# Patient Record
Sex: Female | Born: 2000 | Race: Black or African American | Hispanic: No | Marital: Single | Smoking: Never smoker
Health system: Southern US, Community
[De-identification: ages and names within clinical notes are randomized; demographics above are authoritative.]

## PROBLEM LIST (undated history)

## (undated) DIAGNOSIS — I10 Essential (primary) hypertension: Secondary | ICD-10-CM

## (undated) DIAGNOSIS — F419 Anxiety disorder, unspecified: Secondary | ICD-10-CM

## (undated) DIAGNOSIS — E282 Polycystic ovarian syndrome: Secondary | ICD-10-CM

## (undated) HISTORY — PX: CHOLECYSTECTOMY: SHX55

---

## 2020-10-11 ENCOUNTER — Emergency Department (HOSPITAL_COMMUNITY)
Admission: EM | Admit: 2020-10-11 | Discharge: 2020-10-12 | Disposition: A | Discharge status: Home / Self Care | Payer: Medicaid Other | Attending: Emergency Medicine | Admitting: Emergency Medicine

## 2020-10-11 ENCOUNTER — Encounter (HOSPITAL_COMMUNITY): Payer: Self-pay | Admitting: Emergency Medicine

## 2020-10-11 DIAGNOSIS — F1590 Other stimulant use, unspecified, uncomplicated: Secondary | ICD-10-CM | POA: Insufficient documentation

## 2020-10-11 DIAGNOSIS — R000 Tachycardia, unspecified: Secondary | ICD-10-CM | POA: Insufficient documentation

## 2020-10-11 DIAGNOSIS — R4182 Altered mental status, unspecified: Secondary | ICD-10-CM | POA: Diagnosis not present

## 2020-10-11 DIAGNOSIS — R110 Nausea: Secondary | ICD-10-CM | POA: Diagnosis not present

## 2020-10-11 DIAGNOSIS — F410 Panic disorder [episodic paroxysmal anxiety] without agoraphobia: Secondary | ICD-10-CM | POA: Diagnosis not present

## 2020-10-11 DIAGNOSIS — F1290 Cannabis use, unspecified, uncomplicated: Secondary | ICD-10-CM

## 2020-10-11 DIAGNOSIS — F419 Anxiety disorder, unspecified: Secondary | ICD-10-CM | POA: Insufficient documentation

## 2020-10-11 HISTORY — DX: Anxiety disorder, unspecified: F41.9

## 2020-10-11 MED ORDER — ONDANSETRON HCL 4 MG/2ML IJ SOLN
4.0000 mg | Freq: Once | INTRAMUSCULAR | Status: AC
  Administered 2020-10-12: 4 mg via INTRAVENOUS
  Filled 2020-10-11: qty 2

## 2020-10-11 NOTE — ED Notes (Signed)
Patient vomiting and spitting on the floor despite emesis bag and trash can availability.

## 2020-10-11 NOTE — ED Triage Notes (Signed)
Patient here from [facility] reporting anxiety. Hx of same. States "I'm having a panic attack". Patient thinks she is going to die.

## 2020-10-11 NOTE — ED Provider Notes (Signed)
[facility]-EMERGENCY DEPT Provider Note   CSN: [id number] Arrival date & time: 10/11/20  2237     History Chief Complaint  Patient presents with  . Anxiety    Kristina Hines is a 20 y.o. female presents to the Emergency Department via EMS for complaint of "anxiety."  Patient reports she is nauseated but refuses to answer any additional questions.  RN reports patient has been vomiting on the floor.  No airway compromise.  Level 5 caveat for altered mental status vs uncooperative with history.    Patient's brother is on FaceTime.  Additional history gathered from him.  He reports that she called him from the emergency department approximately 5 minutes prior to my discussion stating that she was anxious.  He reports he has no additional information about what happened tonight.  He reports history of one episode of panic attack prior but he does not know when.  He reports he has no knowledge of any medical history, medications or allergies for her.   The history is provided by the patient, medical records and a relative. The history is limited by the condition of the patient. No language interpreter was used.       Past Medical History:  Diagnosis Date  . Anxiety     There are no problems to display for this patient.   OB History   No obstetric history on file.     No family history on file.  Social History   Tobacco Use  . Smoking status: Never Smoker  . Smokeless tobacco: Never Used  Substance Use Topics  . Alcohol use: Never  . Drug use: Never    Home Medications Prior to Admission medications   Medication Sig Start Date End Date Taking? Authorizing Provider  Levonorgestrel (SKYLA) 13.5 MG IUD 1 each by Intrauterine route once. 2021 June   Yes [provider]    Allergies    Patient has no known allergies.  Review of Systems   Review of Systems  Unable to perform ROS: Mental status change  Gastrointestinal: Positive for  nausea and vomiting.  Psychiatric/Behavioral: The patient is nervous/anxious.     Physical Exam Updated Vital Signs BP (!) 160/102 (BP Location: Right Arm)   Pulse (!) 104   Temp 98.7 F (37.1 C) (Oral)   Resp 20   SpO2 95%   Physical Exam Vitals and nursing note reviewed.  Constitutional:      General: She is not in acute distress.    Appearance: She is not diaphoretic.     Comments: Looks at me when I am speaking to her.  Makes eye contact.  Answers no questions.  Only states that she is nauseated.  HENT:     Head: Normocephalic.  Eyes:     General: No scleral icterus.    Conjunctiva/sclera: Conjunctivae normal.  Cardiovascular:     Rate and Rhythm: Regular rhythm. Tachycardia present.     Pulses: Normal pulses.          Radial pulses are 2+ on the right side and 2+ on the left side.  Pulmonary:     Effort: Pulmonary effort is normal. No tachypnea, accessory muscle usage, prolonged expiration, respiratory distress or retractions.     Breath sounds: Normal breath sounds. No stridor.     Comments: Equal chest rise. No increased work of breathing. Abdominal:     General: There is no distension.     Palpations: Abdomen is soft.     Tenderness:  There is no abdominal tenderness. There is no guarding or rebound.  Musculoskeletal:     Cervical back: Normal range of motion.     Comments: Moves all extremities equally and without difficulty.  Skin:    General: Skin is warm and dry.     Capillary Refill: Capillary refill takes less than 2 seconds.  Neurological:     Mental Status: She is alert.     Comments: Speech is clear.  Refuses to answer questions.  Will follow commands.  No facial droop.  Moving all extremities independently.  Psychiatric:        Mood and Affect: Mood normal.     ED Results / Procedures / Treatments   Labs (all labs ordered are listed, but only abnormal results are displayed) Labs Reviewed  COMPREHENSIVE METABOLIC PANEL - Abnormal; Notable for the  following components:      Result Value   Glucose, Bld 167 (*)    All other components within normal limits  URINALYSIS, ROUTINE W REFLEX MICROSCOPIC - Abnormal; Notable for the following components:   APPearance HAZY (*)    Hgb urine dipstick MODERATE (*)    Leukocytes,Ua SMALL (*)    Bacteria, UA RARE (*)    All other components within normal limits  RAPID URINE DRUG SCREEN, HOSP PERFORMED - Abnormal; Notable for the following components:   Tetrahydrocannabinol POSITIVE (*)    All other components within normal limits  CBC  LIPASE, BLOOD  ETHANOL  HCG, SERUM, QUALITATIVE    Radiology CT Head Wo Contrast  Result Date: 10/12/2020 CLINICAL DATA:  Panic attack.  Mental status change. EXAM: CT HEAD WITHOUT CONTRAST TECHNIQUE: Contiguous axial images were obtained from the base of the skull through the vertex without intravenous contrast. COMPARISON:  None. FINDINGS: Brain: No evidence of acute infarction, hemorrhage, hydrocephalus, extra-axial collection or mass lesion/mass effect. Vascular: No hyperdense vessel or unexpected calcification. Skull: Normal. Negative for fracture or focal lesion. Sinuses/Orbits: No acute finding. IMPRESSION: Negative head CT. Electronically Signed   By: Marnee Spring M.D.   On: 10/12/2020 05:04    Procedures Procedures   Medications Ordered in ED Medications  ondansetron (ZOFRAN) injection 4 mg (4 mg Intravenous Given 10/12/20 0113)    ED Course  I have reviewed the triage vital signs and the nursing notes.  Pertinent labs & imaging results that were available during my care of the patient were reviewed by me and considered in my medical decision making (see chart for details).    MDM Rules/Calculators/A&P                           Presents to emergency department via EMS with a chief complaint of anxiety.  Patient unable or unwilling to answer history questions. No one is here to assist with HPI.  Patient has been actively vomiting here in the  emergency department.  Unknown drug or alcohol ingestion tonight.  Patient looks at me when I speak to her but will answer no questions.  She is moving all extremities without difficulty.  Easily tracks with full EOMs.  History from brother on the phone and is reassuring however he was not present for tonight's incident.  We will start broader work-up.  Concern for psychiatric disease, alcohol or drug intoxication.  Gastroenteritis, anxiety.  Exam is nonfocal.  Given age, low risk and nonfocal neuro exam highly doubt CVA, subarachnoid hemorrhage or other neurologic deficit.  3:03 AM  Mother at bedside.  Reports the child is not talking to her either.  Unknown what happened tonight.  Patient continues to refuse to answer any of my questions.  Will obtain head CT.  5:29 AM Patient continues to refuse to answer my questions however is video chatting and texting on her phone without difficulty.  CT head without acute abnormality.  I personally evaluated these images.  6:23 AM Patient continues to follow commands, walks without difficulty and is provided urine sample.  She refuses to give me any additional history.  Work-up is reassuring.  At this time there is no metabolic cause.   6:52 AM UA without evidence of urinary tract infection.  UDS with marijuana.  Patient continues to refuse to answer questions however indicates that she is ready to go home.  Mother is comfortable with this.  Patient will follow up with her primary care physician.    Final Clinical Impression(s) / ED Diagnoses Final diagnoses:  Anxiety  Altered mental status, unspecified altered mental status type  Marijuana use    Rx / DC Orders ED Discharge Orders    None       Selenne Coggin, Boyd Kerbs 10/12/20 3893    Nira Conn, MD 10/13/20 1827

## 2020-10-12 ENCOUNTER — Emergency Department (HOSPITAL_COMMUNITY): Payer: Medicaid Other

## 2020-10-12 LAB — COMPREHENSIVE METABOLIC PANEL
ALT: 15 U/L (ref 0–44)
AST: 17 U/L (ref 15–41)
Albumin: 3.8 g/dL (ref 3.5–5.0)
Alkaline Phosphatase: 73 U/L (ref 38–126)
Anion gap: 11 (ref 5–15)
BUN: 17 mg/dL (ref 6–20)
CO2: 25 mmol/L (ref 22–32)
Calcium: 9.1 mg/dL (ref 8.9–10.3)
Chloride: 103 mmol/L (ref 98–111)
Creatinine, Ser: 0.83 mg/dL (ref 0.44–1.00)
GFR, Estimated: >60 mL/min (ref >60)
Glucose, Bld: 167 mg/dL — ABNORMAL HIGH (ref 70–99)
Potassium: 3.9 mmol/L (ref 3.5–5.1)
Sodium: 139 mmol/L (ref 135–145)
Total Bilirubin: 0.3 mg/dL (ref 0.3–1.2)
Total Protein: 7.3 g/dL (ref 6.5–8.1)

## 2020-10-12 LAB — CBC
HCT: 39.4 % (ref 36.0–46.0)
Hemoglobin: 12.3 g/dL (ref 12.0–15.0)
MCH: 28.4 pg (ref 26.0–34.0)
MCHC: 31.2 g/dL (ref 30.0–36.0)
MCV: 91 fL (ref 80.0–100.0)
Platelets: 233 10*3/uL (ref 150–400)
RBC: 4.33 MIL/uL (ref 3.87–5.11)
RDW: 13.2 % (ref 11.5–15.5)
WBC: 5.9 10*3/uL (ref 4.0–10.5)
nRBC: 0 % (ref 0.0–0.2)

## 2020-10-12 LAB — URINALYSIS, ROUTINE W REFLEX MICROSCOPIC
Bilirubin Urine: NEGATIVE
Glucose, UA: NEGATIVE mg/dL
Ketones, ur: NEGATIVE mg/dL
Nitrite: NEGATIVE
Protein, ur: NEGATIVE mg/dL
Specific Gravity, Urine: 1.019 (ref 1.005–1.030)
pH: 8 (ref 5.0–8.0)

## 2020-10-12 LAB — RAPID URINE DRUG SCREEN, HOSP PERFORMED
Amphetamines: NOT DETECTED
Barbiturates: NOT DETECTED
Benzodiazepines: NOT DETECTED
Cocaine: NOT DETECTED
Opiates: NOT DETECTED
Tetrahydrocannabinol: POSITIVE — AB

## 2020-10-12 LAB — ETHANOL: Alcohol, Ethyl (B): <10 mg/dL (ref <10)

## 2020-10-12 LAB — LIPASE, BLOOD: Lipase: 22 U/L (ref 11–51)

## 2020-10-12 LAB — HCG, SERUM, QUALITATIVE: Preg, Serum: NEGATIVE

## 2020-10-12 NOTE — Discharge Instructions (Addendum)
1. Medications: usual home medications 2. Treatment: rest, drink plenty of fluids,  3. Follow Up: Please followup with your primary doctor in 2-3 days for discussion of your diagnoses and further evaluation after today's visit; if you do not have a primary care doctor use the resource guide provided to find one; Please return to the ER for return or worsening of symptoms

## 2020-10-12 NOTE — ED Notes (Signed)
Patient transported to CT 

## 2020-10-12 NOTE — ED Notes (Signed)
Pt brought to the bathroom to provide a urine specimen, and provided an insufficient amount to analyze.

## 2021-02-14 ENCOUNTER — Other Ambulatory Visit: Payer: Self-pay

## 2021-02-14 ENCOUNTER — Emergency Department (HOSPITAL_BASED_OUTPATIENT_CLINIC_OR_DEPARTMENT_OTHER): Payer: Medicaid Other | Admitting: Radiology

## 2021-02-14 ENCOUNTER — Encounter (HOSPITAL_BASED_OUTPATIENT_CLINIC_OR_DEPARTMENT_OTHER): Payer: Self-pay

## 2021-02-14 DIAGNOSIS — Z20822 Contact with and (suspected) exposure to covid-19: Secondary | ICD-10-CM | POA: Insufficient documentation

## 2021-02-14 DIAGNOSIS — R110 Nausea: Secondary | ICD-10-CM | POA: Diagnosis not present

## 2021-02-14 DIAGNOSIS — R519 Headache, unspecified: Secondary | ICD-10-CM | POA: Diagnosis not present

## 2021-02-14 DIAGNOSIS — R1010 Upper abdominal pain, unspecified: Secondary | ICD-10-CM | POA: Diagnosis not present

## 2021-02-14 DIAGNOSIS — I10 Essential (primary) hypertension: Secondary | ICD-10-CM | POA: Insufficient documentation

## 2021-02-14 DIAGNOSIS — R509 Fever, unspecified: Secondary | ICD-10-CM | POA: Diagnosis not present

## 2021-02-14 DIAGNOSIS — R1013 Epigastric pain: Secondary | ICD-10-CM | POA: Insufficient documentation

## 2021-02-14 DIAGNOSIS — N9489 Other specified conditions associated with female genital organs and menstrual cycle: Secondary | ICD-10-CM | POA: Diagnosis not present

## 2021-02-14 LAB — URINALYSIS, ROUTINE W REFLEX MICROSCOPIC
Bilirubin Urine: NEGATIVE
Glucose, UA: NEGATIVE mg/dL
Hgb urine dipstick: NEGATIVE
Ketones, ur: NEGATIVE mg/dL
Nitrite: NEGATIVE
Protein, ur: 30 mg/dL — AB
Specific Gravity, Urine: 1.029 (ref 1.005–1.030)
pH: 8 (ref 5.0–8.0)

## 2021-02-14 LAB — CBC
HCT: 37.3 % (ref 36.0–46.0)
Hemoglobin: 12.1 g/dL (ref 12.0–15.0)
MCH: 28.4 pg (ref 26.0–34.0)
MCHC: 32.4 g/dL (ref 30.0–36.0)
MCV: 87.6 fL (ref 80.0–100.0)
Platelets: 280 10*3/uL (ref 150–400)
RBC: 4.26 MIL/uL (ref 3.87–5.11)
RDW: 13 % (ref 11.5–15.5)
WBC: 6 10*3/uL (ref 4.0–10.5)
nRBC: 0 % (ref 0.0–0.2)

## 2021-02-14 LAB — PREGNANCY, URINE: Preg Test, Ur: NEGATIVE

## 2021-02-14 LAB — LIPASE, BLOOD: Lipase: 12 U/L (ref 11–51)

## 2021-02-14 LAB — COMPREHENSIVE METABOLIC PANEL
ALT: 13 U/L (ref 0–44)
AST: 14 U/L — ABNORMAL LOW (ref 15–41)
Albumin: 4 g/dL (ref 3.5–5.0)
Alkaline Phosphatase: 66 U/L (ref 38–126)
Anion gap: 8 (ref 5–15)
BUN: 13 mg/dL (ref 6–20)
CO2: 25 mmol/L (ref 22–32)
Calcium: 9.6 mg/dL (ref 8.9–10.3)
Chloride: 103 mmol/L (ref 98–111)
Creatinine, Ser: 0.71 mg/dL (ref 0.44–1.00)
GFR, Estimated: >60 mL/min (ref >60)
Glucose, Bld: 126 mg/dL — ABNORMAL HIGH (ref 70–99)
Potassium: 4.1 mmol/L (ref 3.5–5.1)
Sodium: 136 mmol/L (ref 135–145)
Total Bilirubin: 0.3 mg/dL (ref 0.3–1.2)
Total Protein: 8.5 g/dL — ABNORMAL HIGH (ref 6.5–8.1)

## 2021-02-14 NOTE — ED Triage Notes (Signed)
Reports having abd pain and fever x 2 days.  Reports it hurts on both upper quads and in shoulder blade to neck. Had gallbladder removed last month.

## 2021-02-15 ENCOUNTER — Emergency Department (HOSPITAL_BASED_OUTPATIENT_CLINIC_OR_DEPARTMENT_OTHER)
Admission: EM | Admit: 2021-02-15 | Discharge: 2021-02-15 | Disposition: A | Discharge status: Home / Self Care | Payer: Medicaid Other | Attending: Emergency Medicine | Admitting: Emergency Medicine

## 2021-02-15 ENCOUNTER — Other Ambulatory Visit: Payer: Self-pay

## 2021-02-15 ENCOUNTER — Emergency Department (HOSPITAL_COMMUNITY)
Admission: EM | Admit: 2021-02-15 | Discharge: 2021-02-15 | Disposition: A | Discharge status: Home / Self Care | Payer: Medicaid Other | Source: Home / Self Care | Attending: Emergency Medicine | Admitting: Emergency Medicine

## 2021-02-15 ENCOUNTER — Encounter (HOSPITAL_COMMUNITY): Payer: Self-pay | Admitting: Emergency Medicine

## 2021-02-15 DIAGNOSIS — R1010 Upper abdominal pain, unspecified: Secondary | ICD-10-CM | POA: Insufficient documentation

## 2021-02-15 DIAGNOSIS — R110 Nausea: Secondary | ICD-10-CM | POA: Insufficient documentation

## 2021-02-15 DIAGNOSIS — I10 Essential (primary) hypertension: Secondary | ICD-10-CM | POA: Insufficient documentation

## 2021-02-15 DIAGNOSIS — N9489 Other specified conditions associated with female genital organs and menstrual cycle: Secondary | ICD-10-CM | POA: Insufficient documentation

## 2021-02-15 DIAGNOSIS — Z9049 Acquired absence of other specified parts of digestive tract: Secondary | ICD-10-CM | POA: Insufficient documentation

## 2021-02-15 DIAGNOSIS — R519 Headache, unspecified: Secondary | ICD-10-CM | POA: Insufficient documentation

## 2021-02-15 DIAGNOSIS — R509 Fever, unspecified: Secondary | ICD-10-CM | POA: Insufficient documentation

## 2021-02-15 DIAGNOSIS — Z20822 Contact with and (suspected) exposure to covid-19: Secondary | ICD-10-CM | POA: Insufficient documentation

## 2021-02-15 DIAGNOSIS — R1013 Epigastric pain: Secondary | ICD-10-CM

## 2021-02-15 DIAGNOSIS — R109 Unspecified abdominal pain: Secondary | ICD-10-CM

## 2021-02-15 HISTORY — DX: Polycystic ovarian syndrome: E28.2

## 2021-02-15 HISTORY — DX: Essential (primary) hypertension: I10

## 2021-02-15 LAB — LIPASE, BLOOD: Lipase: 24 U/L (ref 11–51)

## 2021-02-15 LAB — COMPREHENSIVE METABOLIC PANEL
ALT: 17 U/L (ref 0–44)
AST: 20 U/L (ref 15–41)
Albumin: 3.7 g/dL (ref 3.5–5.0)
Alkaline Phosphatase: 76 U/L (ref 38–126)
Anion gap: 6 (ref 5–15)
BUN: 12 mg/dL (ref 6–20)
CO2: 26 mmol/L (ref 22–32)
Calcium: 8.9 mg/dL (ref 8.9–10.3)
Chloride: 102 mmol/L (ref 98–111)
Creatinine, Ser: 0.75 mg/dL (ref 0.44–1.00)
GFR, Estimated: >60 mL/min (ref >60)
Glucose, Bld: 88 mg/dL (ref 70–99)
Potassium: 4 mmol/L (ref 3.5–5.1)
Sodium: 134 mmol/L — ABNORMAL LOW (ref 135–145)
Total Bilirubin: 0.6 mg/dL (ref 0.3–1.2)
Total Protein: 8.5 g/dL — ABNORMAL HIGH (ref 6.5–8.1)

## 2021-02-15 LAB — CBC WITH DIFFERENTIAL/PLATELET
Abs Immature Granulocytes: 0.01 10*3/uL (ref 0.00–0.07)
Basophils Absolute: 0 10*3/uL (ref 0.0–0.1)
Basophils Relative: 0 %
Eosinophils Absolute: 0.1 10*3/uL (ref 0.0–0.5)
Eosinophils Relative: 2 %
HCT: 38.6 % (ref 36.0–46.0)
Hemoglobin: 12.6 g/dL (ref 12.0–15.0)
Immature Granulocytes: 0 %
Lymphocytes Relative: 35 %
Lymphs Abs: 1.7 10*3/uL (ref 0.7–4.0)
MCH: 29.4 pg (ref 26.0–34.0)
MCHC: 32.6 g/dL (ref 30.0–36.0)
MCV: 90.2 fL (ref 80.0–100.0)
Monocytes Absolute: 0.4 10*3/uL (ref 0.1–1.0)
Monocytes Relative: 8 %
Neutro Abs: 2.6 10*3/uL (ref 1.7–7.7)
Neutrophils Relative %: 55 %
Platelets: 258 10*3/uL (ref 150–400)
RBC: 4.28 MIL/uL (ref 3.87–5.11)
RDW: 12.8 % (ref 11.5–15.5)
WBC: 4.8 10*3/uL (ref 4.0–10.5)
nRBC: 0 % (ref 0.0–0.2)

## 2021-02-15 LAB — URINALYSIS, ROUTINE W REFLEX MICROSCOPIC
Bacteria, UA: NONE SEEN
Bilirubin Urine: NEGATIVE
Glucose, UA: NEGATIVE mg/dL
Hgb urine dipstick: NEGATIVE
Ketones, ur: NEGATIVE mg/dL
Nitrite: NEGATIVE
Protein, ur: NEGATIVE mg/dL
Specific Gravity, Urine: 1.024 (ref 1.005–1.030)
pH: 6 (ref 5.0–8.0)

## 2021-02-15 LAB — RESP PANEL BY RT-PCR (FLU A&B, COVID) ARPGX2
Influenza A by PCR: NEGATIVE
Influenza B by PCR: NEGATIVE
SARS Coronavirus 2 by RT PCR: NEGATIVE

## 2021-02-15 LAB — I-STAT BETA HCG BLOOD, ED (MC, WL, AP ONLY): I-stat hCG, quantitative: <5 m[IU]/mL (ref <5)

## 2021-02-15 MED ORDER — SUCRALFATE 1 G PO TABS: 1.0000 g | ORAL_TABLET | Freq: Four times a day (QID) | ORAL | 0 refills | Status: DC | PRN

## 2021-02-15 MED ORDER — ALUM & MAG HYDROXIDE-SIMETH 200-200-20 MG/5ML PO SUSP
30.0000 mL | Freq: Once | ORAL | Status: AC
  Administered 2021-02-15: 30 mL via ORAL
  Filled 2021-02-15: qty 30

## 2021-02-15 MED ORDER — OMEPRAZOLE 20 MG PO CPDR: 20.0000 mg | DELAYED_RELEASE_CAPSULE | Freq: Every day | ORAL | 1 refills | Status: DC

## 2021-02-15 NOTE — ED Provider Notes (Signed)
[facility] EMERGENCY DEPARTMENT Provider Note   CSN: [id number] Arrival date & time: 02/15/21  1208     History No chief complaint on file.   Kristina Hines is a 20 y.o. female.  20 year old female with history of hypertension, PCOS, anxiety with recent cholecystectomy on July 19 presents with complaint of ongoing upper abdominal pain, worse with eating radiates to left shoulder blade.  Also reports fever with T-max of 102, is having nightly fevers for the past 5 nights.  Patient states that she went to urgent care yesterday where her temperature was 102, temperature eventually improved and she was discharged. No complications with surgery, healed well and cleared by surgery at post op visit. Also reports nausea and a headache.      Past Medical History:  Diagnosis Date   Anxiety    Hypertension    PCOS (polycystic ovarian syndrome)     There are no problems to display for this patient.   Past Surgical History:  Procedure Laterality Date   CHOLECYSTECTOMY       OB History   No obstetric history on file.     No family history on file.  Social History   Tobacco Use   Smoking status: Never   Smokeless tobacco: Never  Vaping Use   Vaping Use: Never used  Substance Use Topics   Alcohol use: Never   Drug use: Never    Home Medications Prior to Admission medications   Medication Sig Start Date End Date Taking? Authorizing Provider  Levonorgestrel (SKYLA) 13.5 MG IUD 1 each by Intrauterine route once. 2021 June    [provider]  omeprazole (PRILOSEC) 20 MG capsule Take 1 capsule (20 mg total) by mouth daily. 02/15/21   Sabas Sous, MD  sucralfate (CARAFATE) 1 g tablet Take 1 tablet (1 g total) by mouth 4 (four) times daily as needed. 02/15/21   Sabas Sous, MD    Allergies    Patient has no known allergies.  Review of Systems   Review of Systems  Constitutional:  Positive for fever.  HENT:  Negative for sore throat.    Respiratory:  Negative for cough.   Cardiovascular:  Negative for chest pain.  Gastrointestinal:  Positive for abdominal pain and nausea. Negative for constipation, diarrhea and vomiting.  Genitourinary:  Negative for dysuria.  Musculoskeletal:  Negative for arthralgias and myalgias.  Skin:  Negative for rash and wound.  Allergic/Immunologic: Negative for immunocompromised state.  Neurological:  Positive for headaches.  Hematological:  Negative for adenopathy.  Psychiatric/Behavioral:  Negative for confusion.   All other systems reviewed and are negative.  Physical Exam Updated Vital Signs BP (!) 136/92 (BP Location: Right Arm)   Pulse 85   Temp 98.5 F (36.9 C) (Oral)   Resp 17   LMP 01/21/2021   SpO2 100%   Physical Exam Vitals and nursing note reviewed.  Constitutional:      General: She is not in acute distress.    Appearance: She is well-developed. She is not diaphoretic.  HENT:     Head: Normocephalic and atraumatic.  Cardiovascular:     Rate and Rhythm: Normal rate and regular rhythm.     Pulses: Normal pulses.     Heart sounds: Normal heart sounds.  Pulmonary:     Effort: Pulmonary effort is normal.     Breath sounds: Normal breath sounds.  Abdominal:     Palpations: Abdomen is soft.     Tenderness: There is no  abdominal tenderness.  Musculoskeletal:     Right lower leg: No edema.     Left lower leg: No edema.  Skin:    General: Skin is warm and dry.     Findings: No erythema or rash.  Neurological:     Mental Status: She is alert and oriented to person, place, and time.  Psychiatric:        Behavior: Behavior normal.    ED Results / Procedures / Treatments   Labs (all labs ordered are listed, but only abnormal results are displayed) Labs Reviewed  RESP PANEL BY RT-PCR (FLU A&B, COVID) ARPGX2  CULTURE, BLOOD (ROUTINE X 2)  CULTURE, BLOOD (ROUTINE X 2)  COMPREHENSIVE METABOLIC PANEL  LIPASE, BLOOD  CBC WITH DIFFERENTIAL/PLATELET  URINALYSIS,  ROUTINE W REFLEX MICROSCOPIC  I-STAT BETA HCG BLOOD, ED ([facility], [facility], [facility] ONLY)    EKG None  Radiology DG Chest 1 View  Result Date: 02/14/2021 CLINICAL DATA:  Abdominal pain and fever x2 days. EXAM: CHEST  1 VIEW COMPARISON:  None. FINDINGS: Decreased lung volumes are seen which is likely secondary to the degree of patient inspiration. There is no evidence of an acute infiltrate, pleural effusion or pneumothorax. The heart size and mediastinal contours are within normal limits. The visualized skeletal structures are unremarkable. IMPRESSION: No active cardiopulmonary disease. Electronically Signed   By: Aram Candela M.D.   On: 02/14/2021 22:14    Procedures Procedures   Medications Ordered in ED Medications - No data to display  ED Course  I have reviewed the triage vital signs and the nursing notes.  Pertinent labs & imaging results that were available during my care of the patient were reviewed by me and considered in my medical decision making (see chart for details).  Clinical Course as of 02/15/21 1553  Sat Feb 15, 2021  4524 20 year old female with abdominal pain, headache and nightly fevers as above.  On exam, is well-appearing, abdomen soft and nontender.  Patient was seen at [facility] ER yesterday/early this morning for same (I believe this is what she is referencing is her urgent care visit).  Labs were reassuring at that time and she was discharged.  Plan is to assess labs, if no significant change, will refer to GI. [LM]  1553 Care signed out pending labs. [LM]    Clinical Course User Index [LM] Alden Hipp   MDM Rules/Calculators/A&P                           Final Clinical Impression(s) / ED Diagnoses Final diagnoses:  None    Rx / DC Orders ED Discharge Orders     None        Jeannie Fend, PA-C 02/15/21 1553    Sloan Leiter, DO 02/16/21 1119

## 2021-02-15 NOTE — ED Notes (Signed)
Pt discharged from [facility] ED this morning.  See lab results from previous visit.

## 2021-02-15 NOTE — ED Provider Notes (Signed)
Care assumed from L. Murphy PA-C at shift change pending labs.  See her note for full H&P.   Briefly this is a 20 yo female presenting for second ED visit today with chief complaint of fevers and left upper quadrant pain radiating to the left shoulder.  Patient had recent cholecystectomy 01/16/2021.  Patient reporting intermittent fevers x5 days.  She is afebrile here as well as at previous ED visit.  No antipyretics taken in the last 6 hours.  Patient had normal labs at previous ED visit.  Plan per previous provider was to repeat labs and reassess patient.  Physical Exam  BP 122/63 (BP Location: Left Arm)   Pulse 73   Temp 98.5 F (36.9 C) (Oral)   Resp 16   LMP 01/21/2021   SpO2 100%   Physical Exam PE: Constitutional: well-developed, well-nourished, no apparent distress HENT: normocephalic, atraumatic. no cervical adenopathy Cardiovascular: normal rate and rhythm, distal pulses intact Pulmonary/Chest: effort normal; breath sounds clear and equal bilaterally; no wheezes or rales Abdominal: soft and nontender Musculoskeletal: full ROM, no edema Neurological: alert with goal directed thinking Skin: warm and dry, no rash, no diaphoresis Psychiatric: normal mood and affect, normal behavior   ED Course/Procedures    Results for orders placed or performed during the hospital encounter of 02/15/21 (from the past 24 hour(s))  Resp Panel by RT-PCR (Flu A&B, Covid) Nasopharyngeal Swab     Status: None   Collection Time: 02/15/21 12:51 PM   Specimen: Nasopharyngeal Swab; Nasopharyngeal(NP) swabs in vial transport medium  Result Value Ref Range   SARS Coronavirus 2 by RT PCR NEGATIVE NEGATIVE   Influenza A by PCR NEGATIVE NEGATIVE   Influenza B by PCR NEGATIVE NEGATIVE  Comprehensive metabolic panel     Status: Abnormal   Collection Time: 02/15/21  3:40 PM  Result Value Ref Range   Sodium 134 (L) 135 - 145 mmol/L   Potassium 4.0 3.5 - 5.1 mmol/L   Chloride 102 98 - 111 mmol/L   CO2 26  22 - 32 mmol/L   Glucose, Bld 88 70 - 99 mg/dL   BUN 12 6 - 20 mg/dL   Creatinine, Ser 4.17 0.44 - 1.00 mg/dL   Calcium 8.9 8.9 - 40.8 mg/dL   Total Protein 8.5 (H) 6.5 - 8.1 g/dL   Albumin 3.7 3.5 - 5.0 g/dL   AST 20 15 - 41 U/L   ALT 17 0 - 44 U/L   Alkaline Phosphatase 76 38 - 126 U/L   Total Bilirubin 0.6 0.3 - 1.2 mg/dL   GFR, Estimated >14 >48 mL/min   Anion gap 6 5 - 15  Lipase, blood     Status: None   Collection Time: 02/15/21  3:40 PM  Result Value Ref Range   Lipase 24 11 - 51 U/L  CBC with Differential     Status: None   Collection Time: 02/15/21  3:40 PM  Result Value Ref Range   WBC 4.8 4.0 - 10.5 K/uL   RBC 4.28 3.87 - 5.11 MIL/uL   Hemoglobin 12.6 12.0 - 15.0 g/dL   HCT 18.5 63.1 - 49.7 %   MCV 90.2 80.0 - 100.0 fL   MCH 29.4 26.0 - 34.0 pg   MCHC 32.6 30.0 - 36.0 g/dL   RDW 02.6 37.8 - 58.8 %   Platelets 258 150 - 400 K/uL   nRBC 0.0 0.0 - 0.2 %   Neutrophils Relative % 55 %   Neutro Abs 2.6 1.7 - 7.7  K/uL   Lymphocytes Relative 35 %   Lymphs Abs 1.7 0.7 - 4.0 K/uL   Monocytes Relative 8 %   Monocytes Absolute 0.4 0.1 - 1.0 K/uL   Eosinophils Relative 2 %   Eosinophils Absolute 0.1 0.0 - 0.5 K/uL   Basophils Relative 0 %   Basophils Absolute 0.0 0.0 - 0.1 K/uL   Immature Granulocytes 0 %   Abs Immature Granulocytes 0.01 0.00 - 0.07 K/uL  Urinalysis, Routine w reflex microscopic Urine, Clean Catch     Status: Abnormal   Collection Time: 02/15/21  3:40 PM  Result Value Ref Range   Color, Urine YELLOW YELLOW   APPearance HAZY (A) CLEAR   Specific Gravity, Urine 1.024 1.005 - 1.030   pH 6.0 5.0 - 8.0   Glucose, UA NEGATIVE NEGATIVE mg/dL   Hgb urine dipstick NEGATIVE NEGATIVE   Bilirubin Urine NEGATIVE NEGATIVE   Ketones, ur NEGATIVE NEGATIVE mg/dL   Protein, ur NEGATIVE NEGATIVE mg/dL   Nitrite NEGATIVE NEGATIVE   Leukocytes,Ua MODERATE (A) NEGATIVE   RBC / HPF 0-5 0 - 5 RBC/hpf   WBC, UA 11-20 0 - 5 WBC/hpf   Bacteria, UA NONE SEEN NONE SEEN    Squamous Epithelial / LPF 6-10 0 - 5   Mucus PRESENT   I-Stat beta hCG blood, ED     Status: None   Collection Time: 02/15/21  3:59 PM  Result Value Ref Range   I-stat hCG, quantitative <5.0 <5 mIU/mL   Comment 3             MDM  Patient received in sign out. Please see previous provider note to include MDM up to this point.   Work-up here is overall unremarkable.  Urine is negative for signs of infection.  Pregnancy test is negative.  She had a chest x-ray at previous ED visit that showed no signs of acute infectious processes.  Blood cultures were collected given patient's complaint of intermittent fever x5 days.  COVID and flu test were negative as well.  Patient continues to have a benign abdomen on serial exams.  Engaged in shared decision-making with patient regarding further imaging.  With a nontender abdomen and normal labs do not feel further work-up is needed at this time of this and patient is agreeable.  Discussed symptomatic home care and follow-up with PCP or GI if symptoms continue.  Strict return precautions discussed.   Portions of this note were generated with Scientist, clinical (histocompatibility and immunogenetics). Dictation errors may occur despite best attempts at proofreading.        Shanon Ace, PA-C 02/15/21 1731    Sloan Leiter, DO 02/16/21 1118

## 2021-02-15 NOTE — ED Provider Notes (Signed)
[facility] EMERGENCY [facility] Emergency Department Provider Note MRN:  [id number]  Arrival date & time: 02/15/21     Chief Complaint   Abdominal Pain   History of Present Illness   Kristina Hines is a 20 y.o. year-old female with a history of anxiety presenting to the ED with chief complaint of abdominal.  Location: Left upper quadrant with radiation to the left shoulder Duration: A few days Onset: Gradual Timing: Intermittent Description: Dull ache Severity: Mild to moderate Exacerbating/Alleviating Factors: None Associated Symptoms: Subjective fever, headache Pertinent Negatives: Denies chest pain or shortness of breath, no dysuria, no hematuria   Review of Systems  A complete 10 system review of systems was obtained and all systems are negative except as noted in the HPI and PMH.   Patient's Health History    Past Medical History:  Diagnosis Date   Anxiety    Hypertension    PCOS (polycystic ovarian syndrome)     Past Surgical History:  Procedure Laterality Date   CHOLECYSTECTOMY      No family history on file.  Social History   Socioeconomic History   Marital status: Single    Spouse name: Not on file   Number of children: Not on file   Years of education: Not on file   Highest education level: Not on file  Occupational History   Not on file  Tobacco Use   Smoking status: Never   Smokeless tobacco: Never  Vaping Use   Vaping Use: Never used  Substance and Sexual Activity   Alcohol use: Never   Drug use: Never   Sexual activity: Never  Other Topics Concern   Not on file  Social History Narrative   Not on file   Social Determinants of Health   Financial Resource Strain: Not on file  Food Insecurity: Not on file  Transportation Needs: Not on file  Physical Activity: Not on file  Stress: Not on file  Social Connections: Not on file  Intimate Partner Violence: Not on file     Physical Exam   Vitals:   02/15/21 0030 02/15/21 0200   BP: 131/68 127/61  Pulse: 83 67  Resp: 18 18  Temp:    SpO2: 97% 98%    CONSTITUTIONAL: Well-appearing, NAD NEURO:  Alert and oriented x 3, no focal deficits EYES:  eyes equal and reactive ENT/NECK:  no LAD, no JVD CARDIO: Regular rate, well-perfused, normal S1 and S2 PULM:  CTAB no wheezing or rhonchi GI/GU:  normal bowel sounds, non-distended, non-tender MSK/SPINE:  No gross deformities, no edema SKIN:  no rash, atraumatic PSYCH:  Appropriate speech and behavior  *Additional and/or pertinent findings included in MDM below  Diagnostic and Interventional Summary    EKG Interpretation  Date/Time:  Friday February 14 2021 21:17:07 EDT Ventricular Rate:  98 PR Interval:  122 QRS Duration: 82 QT Interval:  336 QTC Calculation: 428 R Axis:   40 Text Interpretation: Normal sinus rhythm Normal ECG Confirmed by Ernie Avena ([id number]) on 02/15/2021 12:02:40 AM       Labs Reviewed  COMPREHENSIVE METABOLIC PANEL - Abnormal; Notable for the following components:      Result Value   Glucose, Bld 126 (*)    Total Protein 8.5 (*)    AST 14 (*)    All other components within normal limits  URINALYSIS, ROUTINE W REFLEX MICROSCOPIC - Abnormal; Notable for the following components:   APPearance HAZY (*)    Protein, ur 30 (*)    Leukocytes,Ua  LARGE (*)    Bacteria, UA FEW (*)    All other components within normal limits  LIPASE, BLOOD  CBC  PREGNANCY, URINE    DG Chest 1 View  Final Result      Medications  alum & mag hydroxide-simeth (MAALOX/MYLANTA) 200-200-20 MG/5ML suspension 30 mL (30 mLs Oral Given 02/15/21 0122)     Procedures  /  Critical Care Procedures  ED Course and Medical Decision Making  I have reviewed the triage vital signs, the nursing notes, and pertinent available records from the EMR.  Listed above are laboratory and imaging tests that I personally ordered, reviewed, and interpreted and then considered in my medical decision making (see below for  details).  Patient with a fairly recent history of cholecystectomy.  Initial considerations include retained stone, splenic pathology, however patient is very well-appearing with normal vital signs and a completely benign abdomen, no rebound guarding or rigidity.  There is no leukocytosis, there are normal LFTs, labs are normal.  Discussed management options with patient, decided to try to avoid CT imaging on this otherwise healthy young female given the low likelihood of there being an emergent process.  Favoring a gastritis, possibly viral.  Patient is feeling much better after GI cocktail, appropriate for discharge with return precautions.       Elmer Sow. Pilar Plate, MD [facility] Emergency Medicine [facility] [email or web address]  Final Clinical Impressions(s) / ED Diagnoses     ICD-10-CM   1. Epigastric pain  R10.13       ED Discharge Orders          Ordered    omeprazole (PRILOSEC) 20 MG capsule  Daily        02/15/21 0214    sucralfate (CARAFATE) 1 g tablet  4 times daily PRN        02/15/21 0214             Discharge Instructions Discussed with and Provided to Patient:    Discharge Instructions      You were evaluated in the Emergency Department and after careful evaluation, we did not find any emergent condition requiring admission or further testing in the hospital.  Your exam/testing today is overall reassuring.  Symptoms may be due to acid reflux.  Recommend taking the omeprazole daily to prevent pain.  Use the Carafate medication as needed for immediate relief, up to 4 times daily.  Recommend hydrating at home tomorrow.  Please return to the Emergency Department if you experience any worsening of your condition.   Thank you for allowing [place] to be a part of your care.       Sabas Sous, MD 02/15/21 [phone]

## 2021-02-15 NOTE — ED Notes (Signed)
Pt verbalizes understanding of discharge instructions. Opportunity for questioning and answers were provided. Armand removed by staff, pt discharged from ED to home. Educated to pick up Rx.  

## 2021-02-15 NOTE — Discharge Instructions (Addendum)
You were evaluated in the Emergency Department and after careful evaluation, we did not find any emergent condition requiring admission or further testing in the hospital.  Your exam/testing today is overall reassuring.  Symptoms may be due to acid reflux.  Recommend taking the omeprazole daily to prevent pain.  Use the Carafate medication as needed for immediate relief, up to 4 times daily.  Recommend hydrating at home tomorrow.  Please return to the Emergency Department if you experience any worsening of your condition.   Thank you for allowing [place] to be a part of your care.

## 2021-02-15 NOTE — ED Triage Notes (Signed)
Reports fever, headache, nausea, L shoulder pain, and RUQ pain x 3 days.  Cholecystectomy last month.

## 2021-02-15 NOTE — Discharge Instructions (Addendum)
Your lab work was all normal. Your covid and flu tests were negative.  Continue to take Tylenol and ibuprofen for fever and headaches at home.  Follow-up with your primary care doctor if you continue to have symptoms.  If you continue to have abdominal pain you can follow-up with the local GI group.  I have given you the information for the on-call group [facility].  Call their office if needed.

## 2021-02-16 ENCOUNTER — Other Ambulatory Visit: Payer: Self-pay

## 2021-02-16 ENCOUNTER — Encounter (HOSPITAL_BASED_OUTPATIENT_CLINIC_OR_DEPARTMENT_OTHER): Payer: Self-pay | Admitting: Emergency Medicine

## 2021-02-16 ENCOUNTER — Emergency Department (HOSPITAL_BASED_OUTPATIENT_CLINIC_OR_DEPARTMENT_OTHER): Payer: Medicaid Other

## 2021-02-16 ENCOUNTER — Emergency Department (HOSPITAL_BASED_OUTPATIENT_CLINIC_OR_DEPARTMENT_OTHER)
Admission: EM | Admit: 2021-02-16 | Discharge: 2021-02-17 | Disposition: A | Discharge status: Home / Self Care | Payer: Medicaid Other | Attending: Emergency Medicine | Admitting: Emergency Medicine

## 2021-02-16 DIAGNOSIS — M542 Cervicalgia: Secondary | ICD-10-CM | POA: Diagnosis not present

## 2021-02-16 DIAGNOSIS — Z20822 Contact with and (suspected) exposure to covid-19: Secondary | ICD-10-CM | POA: Insufficient documentation

## 2021-02-16 DIAGNOSIS — I10 Essential (primary) hypertension: Secondary | ICD-10-CM | POA: Diagnosis not present

## 2021-02-16 DIAGNOSIS — Z7984 Long term (current) use of oral hypoglycemic drugs: Secondary | ICD-10-CM | POA: Insufficient documentation

## 2021-02-16 DIAGNOSIS — R110 Nausea: Secondary | ICD-10-CM | POA: Insufficient documentation

## 2021-02-16 DIAGNOSIS — R509 Fever, unspecified: Secondary | ICD-10-CM | POA: Insufficient documentation

## 2021-02-16 DIAGNOSIS — R519 Headache, unspecified: Secondary | ICD-10-CM | POA: Diagnosis present

## 2021-02-16 DIAGNOSIS — R109 Unspecified abdominal pain: Secondary | ICD-10-CM | POA: Insufficient documentation

## 2021-02-16 DIAGNOSIS — Z79899 Other long term (current) drug therapy: Secondary | ICD-10-CM | POA: Diagnosis not present

## 2021-02-16 MED ORDER — IBUPROFEN 400 MG PO TABS
600.0000 mg | ORAL_TABLET | Freq: Once | ORAL | Status: AC
  Administered 2021-02-16: 600 mg via ORAL
  Filled 2021-02-16: qty 1

## 2021-02-16 MED ORDER — KETOROLAC TROMETHAMINE 30 MG/ML IJ SOLN
30.0000 mg | Freq: Once | INTRAMUSCULAR | Status: AC
  Administered 2021-02-16: 30 mg via INTRAMUSCULAR
  Filled 2021-02-16: qty 1

## 2021-02-16 MED ORDER — PROMETHAZINE HCL 25 MG/ML IJ SOLN
25.0000 mg | Freq: Four times a day (QID) | INTRAMUSCULAR | Status: DC | PRN
  Administered 2021-02-16: 25 mg via INTRAMUSCULAR
  Filled 2021-02-16: qty 1

## 2021-02-16 NOTE — ED Provider Notes (Signed)
[facility] EMERGENCY DEPT Provider Note   CSN: [id number] Arrival date & time: 02/16/21  1736     History Chief Complaint  Patient presents with   Fever    Kristina Hines is a 20 y.o. female.  HPI     This is a 20 year old female with a history of anxiety, hypertension, PCOS who presents with headache, neck pain, fever.  Reports subjective fevers over the last 5 days.  She is only taken her temperature 2 days ago when it was 102.  She was seen and evaluated in the emergency room twice over the last 2 days for abdominal pain.  She been afebrile during this visits.  She was seen mostly for abdominal pain but states at that time she also had some nausea and headache.  She describes the headache as 10 out of 10.  It is throbbing.  It originates in her neck and radiates upwards.  She denies vision changes, weakness, numbness, strokelike symptoms.  She denies neck stiffness.  No known sick contacts.  Tested negative for COVID and flu 2 days ago.  Denies sore throat or rash.  She has taken Tylenol with minimal relief.  Past Medical History:  Diagnosis Date   Anxiety    Hypertension    PCOS (polycystic ovarian syndrome)     There are no problems to display for this patient.   Past Surgical History:  Procedure Laterality Date   CHOLECYSTECTOMY       OB History   No obstetric history on file.     No family history on file.  Social History   Tobacco Use   Smoking status: Never   Smokeless tobacco: Never  Vaping Use   Vaping Use: Never used  Substance Use Topics   Alcohol use: Never   Drug use: Never    Home Medications Prior to Admission medications   Medication Sig Start Date End Date Taking? Authorizing Provider  amLODipine (NORVASC) 5 MG tablet Take 5 mg by mouth daily.    [provider]  busPIRone (BUSPAR) 5 MG tablet Take 5 mg by mouth 2 (two) times daily. 10/16/20   [provider]  HYDROcodone-acetaminophen (NORCO) 10-325 MG  tablet Take 1 tablet by mouth every 6 (six) hours as needed for pain. 01/21/21   [provider]  Levonorgestrel (SKYLA) 13.5 MG IUD 1 each by Intrauterine route once. 2021 June    [provider]  metFORMIN (GLUCOPHAGE) 500 MG tablet Take 500 mg by mouth in the morning and at bedtime.    [provider]  omeprazole (PRILOSEC) 20 MG capsule Take 1 capsule (20 mg total) by mouth daily. 02/15/21   Sabas Sous, MD  sucralfate (CARAFATE) 1 g tablet Take 1 tablet (1 g total) by mouth 4 (four) times daily as needed. 02/15/21   Sabas Sous, MD    Allergies    Patient has no known allergies.  Review of Systems   Review of Systems  Constitutional:  Positive for fever.  Respiratory:  Negative for chest tightness and shortness of breath.   Cardiovascular:  Negative for chest pain.  Gastrointestinal:  Positive for nausea. Negative for abdominal pain and vomiting.  Musculoskeletal:  Positive for neck pain. Negative for neck stiffness.  Neurological:  Positive for headaches. Negative for dizziness, speech difficulty and weakness.  All other systems reviewed and are negative.  Physical Exam Updated Vital Signs BP 125/68   Pulse 80   Temp 99.8 F (37.7 C) (Oral)  Resp 18   LMP 01/21/2021   SpO2 97%   Physical Exam Vitals and nursing note reviewed.  Constitutional:      Appearance: She is well-developed. She is obese. She is not ill-appearing.  HENT:     Head: Normocephalic and atraumatic.     Nose: Nose normal. No congestion.     Mouth/Throat:     Mouth: Mucous membranes are moist.     Comments: Slight erythema, no exudate Eyes:     Extraocular Movements: Extraocular movements intact.     Pupils: Pupils are equal, round, and reactive to light.  Neck:     Comments: No meningismus, full range of motion of the neck, tenderness to palpation over the bilateral paraspinous muscle region of the cervical spine Cardiovascular:     Rate and Rhythm: Normal rate and  regular rhythm.     Heart sounds: Normal heart sounds.  Pulmonary:     Effort: Pulmonary effort is normal. No respiratory distress.     Breath sounds: No wheezing.  Abdominal:     General: Bowel sounds are normal.     Palpations: Abdomen is soft.     Tenderness: There is no abdominal tenderness.  Musculoskeletal:     Cervical back: Normal range of motion and neck supple. No rigidity.  Skin:    General: Skin is warm and dry.  Neurological:     Mental Status: She is alert and oriented to person, place, and time.     Comments: Fluent speech, cranial nerves II through XII intact, 5 out of 5 strength in all 4 extremities, no dysmetria to finger-nose-finger  Psychiatric:        Mood and Affect: Mood normal.    ED Results / Procedures / Treatments   Labs (all labs ordered are listed, but only abnormal results are displayed) Labs Reviewed  RESP PANEL BY RT-PCR (FLU A&B, COVID) [id number]    EKG None  Radiology CT HEAD WO CONTRAST ( )  Result Date: 02/16/2021 CLINICAL DATA:  Headache, intracranial hemorrhage suspected Patient reports fever and headache for 1 week. EXAM: CT HEAD WITHOUT CONTRAST TECHNIQUE: Contiguous axial images were obtained from the base of the skull through the vertex without intravenous contrast. COMPARISON:  Head CT 10/12/2020 FINDINGS: Brain: No intracranial hemorrhage, mass effect, or midline shift. No hydrocephalus. The basilar cisterns are patent. No evidence of territorial infarct or acute ischemia. No extra-axial or intracranial fluid collection. Vascular: No hyperdense vessel or unexpected calcification. Skull: No fracture or focal lesion. Sinuses/Orbits: Mucous retention cysts in the maxillary sinuses. No acute findings. Other: None. IMPRESSION: Negative head CT. Electronically Signed   By: Narda Rutherford M.D.   On: 02/16/2021 23:02    Procedures Procedures   Medications Ordered in ED Medications  promethazine (PHENERGAN) injection 25 mg (25 mg  Intramuscular Given 02/16/21 2325)  ibuprofen (ADVIL) tablet 600 mg (600 mg Oral Given 02/16/21 1749)  ketorolac (TORADOL) 30 MG/ML injection 30 mg (30 mg Intramuscular Given 02/16/21 2325)  prochlorperazine (COMPAZINE) injection 10 mg (10 mg Intravenous Given 02/17/21 0120)  diphenhydrAMINE (BENADRYL) injection 25 mg (25 mg Intravenous Given 02/17/21 0119)    ED Course  I have reviewed the triage vital signs and the nursing notes.  Pertinent labs & imaging results that were available during my care of the patient were reviewed by me and considered in my medical decision making (see chart for details).  Clinical Course as of 02/17/21 0235  Mon Feb 17, 2021  0100 Patient continues to endorse headache.  Only slightly improved with Phenergan and Toradol.  Will place IV and give migraine cocktail.  She remains hemodynamically stable and neurologically intact. [CH]  0234 On recheck, patient's symptoms of headache completely resolved. [CH]    Clinical Course User Index [CH] Undrea Archbold, Mayer Masker, MD   MDM Rules/Calculators/A&P                           Patient presents with headache.  Reports fever over the last 4 to 5 days; however, only objective fever greater than 101 2 days ago.  She is afebrile here and I have reviewed her chart.  She has been afebrile during her 2 prior evaluations for abdominal pain.  Prior to my evaluation, she was medically screened.  Mother was concerned for intracranial pathology.  CT scan was ordered shows no evidence of subarachnoid hemorrhage or mass.  Patient is neurologically intact.  Low suspicion for meningitis as she has no nuchal rigidity.  Would suspect if she had bacterial meningitis, this would have already declared itself given reported duration of headache and subjective fevers.  Patient was initially given Phenergan and Toradol.  She subsequently was given Compazine and Benadryl.  She had complete resolution of her symptoms and remained hemodynamically stable.   Repeat COVID and influenza testing are negative.  Given recent abdominal complaints and nausea, could be related to a viral illness.  I encouraged her to stay hydrated and we discussed supportive measures at home.  Do not feel she indicates further evaluation including LP at this time.   After history, exam, and medical workup I feel the patient has been appropriately medically screened and is safe for discharge home. Pertinent diagnoses were discussed with the patient. Patient was given return precautions.  Final Clinical Impression(s) / ED Diagnoses Final diagnoses:  Acute nonintractable headache, unspecified headache type    Rx / DC Orders ED Discharge Orders     None        Wilkie Aye, Mayer Masker, MD 02/17/21 [phone]

## 2021-02-16 NOTE — ED Triage Notes (Signed)
Fever and headache x1 week. Was seen Friday. Symptoms persist.

## 2021-02-16 NOTE — ED Provider Notes (Signed)
Emergency Medicine Provider Triage Evaluation Note  Kristina Hines , a 20 y.o. female  was evaluated in triage.  Pt complains of headache and fever.  She recently moved the area from [place] to start school.  She has had symptoms of fever (T-max 101) and headache for the past 5 days.  She is treated symptoms unsuccessfully with ibuprofen and Tylenol.  She was seen in the emergency department for the past 2 days.  Previous COVID and flu are negative.  Previous visits were for abdominal pain.  Patient complaining only headache and neck pain today.  Review of Systems  Positive: Headache, neck pain, fever Negative: Eye pain, visual disturbance, neck stiffness, weakness, numbness, cough, congestion.  Physical Exam  BP (!) 149/89   Pulse (!) 110   Temp 100 F (37.8 C) (Oral)   Resp 16   LMP 01/21/2021   SpO2 99%  Gen:   Awake, no distress   Resp:  Normal effort  MSK:   Moves extremities without difficulty  Other:  No focal neurologic deficits  Medical Decision Making  Medically screening exam initiated at 10:25 PM.  Appropriate orders placed.  Kristina Hines was informed that the remainder of the evaluation will be completed by another provider, this initial triage assessment does not replace that evaluation, and the importance of remaining in the ED until their evaluation is complete.     Gloris Manchester, MD 02/16/21 2229

## 2021-02-17 LAB — RESP PANEL BY RT-PCR (FLU A&B, COVID) ARPGX2
Influenza A by PCR: NEGATIVE
Influenza B by PCR: NEGATIVE
SARS Coronavirus 2 by RT PCR: NEGATIVE

## 2021-02-17 MED ORDER — DIPHENHYDRAMINE HCL 50 MG/ML IJ SOLN
25.0000 mg | Freq: Once | INTRAMUSCULAR | Status: AC
  Administered 2021-02-17: 25 mg via INTRAVENOUS
  Filled 2021-02-17: qty 1

## 2021-02-17 MED ORDER — PROCHLORPERAZINE EDISYLATE 10 MG/2ML IJ SOLN
10.0000 mg | Freq: Once | INTRAMUSCULAR | Status: AC
  Administered 2021-02-17: 10 mg via INTRAVENOUS
  Filled 2021-02-17: qty 2

## 2021-02-17 NOTE — Discharge Instructions (Addendum)
You were seen today for a headache.  Your work-up is reassuring.  This could be related to a viral illness although your COVID and flu testing is negative.  There is very low suspicion for meningitis and your CT scan is also negative for any bleeding or obvious infection.  Follow-up with your primary physician.

## 2021-02-20 LAB — CULTURE, BLOOD (ROUTINE X 2)
Culture: NO GROWTH
Culture: NO GROWTH
Special Requests: ADEQUATE

## 2021-02-28 ENCOUNTER — Encounter: Payer: Self-pay | Admitting: Gastroenterology

## 2021-02-28 ENCOUNTER — Ambulatory Visit (INDEPENDENT_AMBULATORY_CARE_PROVIDER_SITE_OTHER): Payer: Medicaid Other | Admitting: Gastroenterology

## 2021-02-28 VITALS — BP 112/92 | HR 88 | Ht 67.0 in | Wt 269.0 lb

## 2021-02-28 DIAGNOSIS — R194 Change in bowel habit: Secondary | ICD-10-CM

## 2021-02-28 DIAGNOSIS — R1012 Left upper quadrant pain: Secondary | ICD-10-CM | POA: Diagnosis not present

## 2021-02-28 MED ORDER — OMEPRAZOLE 20 MG PO CPDR: 20.0000 mg | DELAYED_RELEASE_CAPSULE | Freq: Every day | ORAL | 2 refills | Status: AC

## 2021-02-28 MED ORDER — METAMUCIL 0.52 G PO CAPS
0.5200 g | ORAL_CAPSULE | Freq: Every day | ORAL | Status: AC
Start: 2021-02-28 — End: ?

## 2021-02-28 MED ORDER — SUCRALFATE 1 G PO TABS: 1.0000 g | ORAL_TABLET | Freq: Four times a day (QID) | ORAL | 1 refills | Status: AC | PRN

## 2021-02-28 NOTE — Patient Instructions (Addendum)
It was my pleasure to provide care to you today. Based on our discussion, I am providing you with my recommendations below:  RECOMMENDATION(S):   Please take 1 dose by mouth of Metamucil daily  PRESCRIPTION MEDICATION(S):   We have sent the following medication(s) to your pharmacy:  Omeprazole Carafate  NOTE: If your medication(s) requires a PRIOR AUTHORIZATION, we will receive notification from your pharmacy. Once received, the process to submit for approval may take up to 7-10 business days. You will be contacted about any denials we have received from your insurance company as well as alternatives recommended by your provider.  ENDOSCOPY:   You have been scheduled for an endoscopy. Please follow written instructions given to you at your visit today.  INHALERS:   If you use inhalers (even only as needed), please bring them with you on the day of your procedure.  MEDICATIONS TO HOLD:  Please refer to your prep instructions regarding holding METFORMIN.   FOLLOW UP:  After your procedure, you will receive a call from my office staff regarding my recommendation for follow up.  BMI:  If you are age 78 or younger, your body mass index should be between 19-25. Your Body mass index is 42.13 kg/m. If this is out of the aformentioned range listed, please consider follow up with your Primary Care Provider.   MY CHART:  The [facility] providers would like to encourage you to use [facility] to communicate with providers for non-urgent requests or questions.  Due to long hold times on the telephone, sending your provider a message by [facility] may be a faster and more efficient way to get a response.  Please allow 48 business hours for a response.  Please remember that this is for non-urgent requests.   Thank you for trusting me with your gastrointestinal care!    Tiajuana Amass, MD

## 2021-02-28 NOTE — Progress Notes (Signed)
HPI : Kristina Hines is a pleasant 20 year old female who presents to [place] with persistent abdominal pain for the past 3 weeks.  She states this pain is located in the left upper quadrant and is associated with excessive belching.  The pain is worse when she eats.  She has had nausea but no vomiting.  She has also had regular bowel habits for the past month or so characterized by either diarrhea or constipation.  No blood in stool.  No weight loss. She was seen in the emergency room twice on August 13 and again on August 14 for this pain as well as headaches and fevers.  Her labs were unremarkable and no abdominal imaging was performed.  Because of the headaches, CT head was performed on August 14 and this was unremarkable.  She was discharged home with omeprazole and Carafate, and she says this has helped. She has been taking Tylenol and ibuprofen on occasion for headaches She underwent a cholecystectomy about 6 weeks ago for symptomatic cholelithiasis.  Patient states her current pain is different than her gallstone pain.   Past Medical History:  Diagnosis Date   Anxiety    Hypertension    PCOS (polycystic ovarian syndrome)      Past Surgical History:  Procedure Laterality Date   CHOLECYSTECTOMY     History reviewed. No pertinent family history. Social History   Tobacco Use   Smoking status: Never   Smokeless tobacco: Never  Vaping Use   Vaping Use: Never used  Substance Use Topics   Alcohol use: Never   Drug use: Never   Current Outpatient Medications  Medication Sig Dispense Refill   amLODipine (NORVASC) 5 MG tablet Take 5 mg by mouth daily.     Levonorgestrel (SKYLA) 13.5 MG IUD 1 each by Intrauterine route once. 2021 June     metFORMIN (GLUCOPHAGE) 500 MG tablet Take 500 mg by mouth in the morning and at bedtime.     omeprazole (PRILOSEC) 20 MG capsule Take 1 capsule (20 mg total) by mouth daily. 30 capsule 1   sucralfate (CARAFATE) 1 g tablet Take 1 tablet (1 g total)  by mouth 4 (four) times daily as needed. 30 tablet 0   No current facility-administered medications for this visit.   No Known Allergies   Review of Systems: All systems reviewed and negative except where noted in HPI.    DG Chest 1 View  Result Date: 02/14/2021 CLINICAL DATA:  Abdominal pain and fever x2 days. EXAM: CHEST  1 VIEW COMPARISON:  None. FINDINGS: Decreased lung volumes are seen which is likely secondary to the degree of patient inspiration. There is no evidence of an acute infiltrate, pleural effusion or pneumothorax. The heart size and mediastinal contours are within normal limits. The visualized skeletal structures are unremarkable. IMPRESSION: No active cardiopulmonary disease. Electronically Signed   By: Aram Candela M.D.   On: 02/14/2021 22:14   CT HEAD WO CONTRAST ( )  Result Date: 02/16/2021 CLINICAL DATA:  Headache, intracranial hemorrhage suspected Patient reports fever and headache for 1 week. EXAM: CT HEAD WITHOUT CONTRAST TECHNIQUE: Contiguous axial images were obtained from the base of the skull through the vertex without intravenous contrast. COMPARISON:  Head CT 10/12/2020 FINDINGS: Brain: No intracranial hemorrhage, mass effect, or midline shift. No hydrocephalus. The basilar cisterns are patent. No evidence of territorial infarct or acute ischemia. No extra-axial or intracranial fluid collection. Vascular: No hyperdense vessel or unexpected calcification. Skull: No fracture or focal lesion. Sinuses/Orbits: Mucous retention  cysts in the maxillary sinuses. No acute findings. Other: None. IMPRESSION: Negative head CT. Electronically Signed   By: Narda Rutherford M.D.   On: 02/16/2021 23:02    Physical Exam: BP (!) 112/92   Pulse 88   Ht 5\' 7"  (1.702 m)   Wt 269 lb (122 kg)   LMP 02/17/2021 (Approximate)   BMI 42.13 kg/m  Constitutional: Pleasant,well-developed, Afirican American female in no acute distress. HEENT: Normocephalic and atraumatic.  Conjunctivae are normal. No scleral icterus. MP2 Neck supple.  Cardiovascular: Normal rate, regular rhythm.  Pulmonary/chest: Effort normal and breath sounds normal. No wheezing, rales or rhonchi. Abdominal: Soft, nondistended, mild tenderness to palpation in the LLQ. Bowel sounds active throughout. There are no masses palpable. No hepatomegaly. Extremities: no edema Lymphadenopathy: No cervical adenopathy noted. Neurological: Alert and oriented to person place and time. Skin: Skin is warm and dry. No rashes noted. Psychiatric: Normal mood and affect. Behavior is normal.  CBC    Component Value Date/Time   WBC 4.8 02/15/2021 1540   RBC 4.28 02/15/2021 1540   HGB 12.6 02/15/2021 1540   HCT 38.6 02/15/2021 1540   PLT 258 02/15/2021 1540   MCV 90.2 02/15/2021 1540   MCH 29.4 02/15/2021 1540   MCHC 32.6 02/15/2021 1540   RDW 12.8 02/15/2021 1540   LYMPHSABS 1.7 02/15/2021 1540   MONOABS 0.4 02/15/2021 1540   EOSABS 0.1 02/15/2021 1540   BASOSABS 0.0 02/15/2021 1540    CMP     Component Value Date/Time   NA 134 (L) 02/15/2021 1540   K 4.0 02/15/2021 1540   CL 102 02/15/2021 1540   CO2 26 02/15/2021 1540   GLUCOSE 88 02/15/2021 1540   BUN 12 02/15/2021 1540   CREATININE 0.75 02/15/2021 1540   CALCIUM 8.9 02/15/2021 1540   PROT 8.5 (H) 02/15/2021 1540   ALBUMIN 3.7 02/15/2021 1540   AST 20 02/15/2021 1540   ALT 17 02/15/2021 1540   ALKPHOS 76 02/15/2021 1540   BILITOT 0.6 02/15/2021 1540   GFRNONAA >60 02/15/2021 1540     ASSESSMENT AND PLAN: 20 year old female with several weeks of left upper quadrant pain in the setting of NSAID use following recent cholecystectomy improving with PPI and Carafate.  She has also had changes in bowel habits manifesting as alternating constipation and diarrhea for the last month.  Her abdominal pain is concerning for peptic ulcer disease.  I explained to her that she is already on the appropriate therapy for an ulcer and that we could treat  empirically versus performing an upper endoscopy.  The patient preferred to proceed with an upper endoscopy. With regards to her change in bowel habits, this may be related to her cholecystectomy versus possible IBS, although her symptoms have not been present long enough to diagnose IBS.  I recommended she start taking a daily fiber supplement to improve her stool consistency.  Left upper quadrant pain - EGD - Continue omeprazole and Carafate  Change in bowel habits - Daily Metamucil  The details, risks (including bleeding, perforation, infection, missed lesions, medication reactions and possible hospitalization or surgery if complications occur), benefits, and alternatives to EGD with possible biopsy and possible dilation were discussed with the patient and she consents to proceed.   Terez Freimark E. 35, MD [facility]    No ref. provider found

## 2021-03-04 ENCOUNTER — Encounter: Payer: Self-pay | Admitting: Gastroenterology

## 2021-03-04 ENCOUNTER — Other Ambulatory Visit: Payer: Self-pay

## 2021-03-04 ENCOUNTER — Ambulatory Visit (AMBULATORY_SURGERY_CENTER): Payer: Medicaid Other | Admitting: Gastroenterology

## 2021-03-04 VITALS — BP 115/67 | HR 60 | Temp 97.8°F | Resp 11 | Ht 67.0 in | Wt 269.0 lb

## 2021-03-04 DIAGNOSIS — R1012 Left upper quadrant pain: Secondary | ICD-10-CM | POA: Diagnosis not present

## 2021-03-04 DIAGNOSIS — K319 Disease of stomach and duodenum, unspecified: Secondary | ICD-10-CM

## 2021-03-04 DIAGNOSIS — R194 Change in bowel habit: Secondary | ICD-10-CM

## 2021-03-04 HISTORY — PX: UPPER GASTROINTESTINAL ENDOSCOPY: SHX188

## 2021-03-04 MED ORDER — SODIUM CHLORIDE 0.9 % IV SOLN: 500.0000 mL | Freq: Once | INTRAVENOUS | Status: DC

## 2021-03-04 NOTE — Progress Notes (Signed)
Called to room to assist during endoscopic procedure.  Patient ID and intended procedure confirmed with present staff. Received instructions for my participation in the procedure from the performing physician.  

## 2021-03-04 NOTE — Progress Notes (Signed)
History and Physical Interval Note:  03/04/2021 11:18 AM  Kristina Hines  has presented today for endoscopic procedure(s), with the diagnosis of  Encounter Diagnoses  Name Primary?   Abdominal pain, left upper quadrant Yes   Change in bowel habits   .  The various methods of evaluation and treatment have been discussed with the patient and/or family. After consideration of risks, benefits and other options for treatment, the patient has consented to  the endoscopic procedure(s).   The patient's history has been reviewed, patient examined, no change in status, stable for endoscopic procedure(s).  I have reviewed the patient's chart and labs.  Questions were answered to the patient's satisfaction.     Frank Novelo E. Tomasa Rand, MD [facility]

## 2021-03-04 NOTE — Op Note (Signed)
[facility] Patient Name: Kristina Hines Procedure Date: 03/04/2021 11:17 AM MRN: [id number] Endoscopist: Lorin Picket E. Tomasa Rand , MD Age: 20 Referring MD:  Date of Birth: 12-11-2000 Gender: Female Account #: [id number] Procedure:                Upper GI endoscopy Indications:              Abdominal pain in the left upper quadrant Medicines:                Monitored Anesthesia Care Procedure:                Pre-Anesthesia Assessment:                           - Prior to the procedure, a History and Physical                            was performed, and patient medications and                            allergies were reviewed. The patient's tolerance of                            previous anesthesia was also reviewed. The risks                            and benefits of the procedure and the sedation                            options and risks were discussed with the patient.                            All questions were answered, and informed consent                            was obtained. Prior Anticoagulants: The patient has                            taken no previous anticoagulant or antiplatelet                            agents. ASA Grade Assessment: II - A patient with                            mild systemic disease. After reviewing the risks                            and benefits, the patient was deemed in                            satisfactory condition to undergo the procedure.                           After obtaining informed consent, the endoscope was  passed under direct vision. Throughout the                            procedure, the patient's blood pressure, pulse, and                            oxygen saturations were monitored continuously. The                            GIF [id number] #[id number] was introduced through the                            mouth, and advanced to the second part of duodenum.                            The  upper GI endoscopy was accomplished without                            difficulty. The patient tolerated the procedure                            well. Scope In: Scope Out: Findings:                 The examined portions of the nasopharynx,                            oropharynx and larynx were normal.                           The examined esophagus was normal.                           The entire examined stomach was normal. Biopsies                            were taken with a cold forceps for Helicobacter                            pylori testing. Estimated blood loss was minimal.                           The examined duodenum was normal. Complications:            No immediate complications. Estimated Blood Loss:     Estimated blood loss was minimal. Impression:               - The examined portions of the nasopharynx,                            oropharynx and larynx were normal.                           - Normal esophagus.                           - Normal  stomach. Biopsied.                           - Normal examined duodenum.                           - No abnormalities to explain patient's abdominal                            pain. Await biopsy results for H. pylori. Recommendation:           - Patient has a contact number available for                            emergencies. The signs and symptoms of potential                            delayed complications were discussed with the                            patient. Return to normal activities tomorrow.                            Written discharge instructions were provided to the                            patient.                           - Resume previous diet.                           - Continue present medications. Ok to stop carafate                            given absence of ulcers/erosions.                           - Await pathology results. Shadaya Marschner E. Tomasa Rand, MD 03/04/2021 11:36:58 AM This report has been  signed electronically.

## 2021-03-04 NOTE — Patient Instructions (Signed)
Resume previous diet Continue current medications( its OK to stop carafate) Await pathology results  YOU HAD AN ENDOSCOPIC PROCEDURE TODAY AT THE [facility]:   Refer to the procedure report that was given to you for any specific questions about what was found during the examination.  If the procedure report does not answer your questions, please call your gastroenterologist to clarify.  If you requested that your care partner not be given the details of your procedure findings, then the procedure report has been included in a sealed envelope for you to review at your convenience later.  YOU SHOULD EXPECT: Some feelings of bloating in the abdomen. Passage of more gas than usual.  Walking can help get rid of the air that was put into your GI tract during the procedure and reduce the bloating. If you had a lower endoscopy (such as a colonoscopy or flexible sigmoidoscopy) you may notice spotting of blood in your stool or on the toilet paper. If you underwent a bowel prep for your procedure, you may not have a normal bowel movement for a few days.  Please Note:  You might notice some irritation and congestion in your nose or some drainage.  This is from the oxygen used during your procedure.  There is no need for concern and it should clear up in a day or so.  SYMPTOMS TO REPORT IMMEDIATELY: Following upper endoscopy (EGD)  Vomiting of blood or coffee ground material  New chest pain or pain under the shoulder blades  Painful or persistently difficult swallowing  New shortness of breath  Fever of 100F or higher  Black, tarry-looking stools  For urgent or emergent issues, a gastroenterologist can be reached at any hour by calling [phone]. Do not use MyChart messaging for urgent concerns.   DIET:  We do recommend a small meal at first, but then you may proceed to your regular diet.  Drink plenty of fluids but you should avoid alcoholic beverages for 24 hours.  ACTIVITY:  You  should plan to take it easy for the rest of today and you should NOT DRIVE or use heavy machinery until tomorrow (because of the sedation medicines used during the test).    FOLLOW UP: Our staff will call the number listed on your records 48-72 hours following your procedure to check on you and address any questions or concerns that you may have regarding the information given to you following your procedure. If we do not reach you, we will leave a message.  We will attempt to reach you two times.  During this call, we will ask if you have developed any symptoms of COVID 19. If you develop any symptoms (ie: fever, flu-like symptoms, shortness of breath, cough etc.) before then, please call [phone].  If you test positive for Covid 19 in the 2 weeks post procedure, please call and report this information to [place].    If any biopsies were taken you will be contacted by phone or by letter within the next 1-3 weeks.  Please call us at [phone] if you have not heard about the biopsies in 3 weeks.   SIGNATURES/CONFIDENTIALITY: You and/or your care partner have signed paperwork which will be entered into your electronic medical record.  These signatures attest to the fact that that the information above on your After Visit Summary has been reviewed and is understood.  Full responsibility of the confidentiality of this discharge information lies with you and/or your care-partner.

## 2021-03-04 NOTE — Progress Notes (Signed)
Pt's states no medical or surgical changes since previsit or office visit.   Vitals CW  Pt states she would like her Mom to come to PACU, informed parents that only Mom is to come to PACU when we call that she is in recovery, verb understanding

## 2021-03-04 NOTE — Progress Notes (Signed)
Report to PACU, RN, vss, BBS= Clear.  

## 2021-03-06 ENCOUNTER — Telehealth: Payer: Self-pay | Admitting: *Deleted

## 2021-03-06 NOTE — Telephone Encounter (Signed)
  Follow up Call-  Call back number 03/04/2021  Post procedure Call Back phone  # [phone]  Permission to leave phone message Yes     Patient questions:  Do you have a fever, pain , or abdominal swelling? No. Pain Score  0 *  Have you tolerated food without any problems? Yes.    Have you been able to return to your normal activities? Yes.    Do you have any questions about your discharge instructions: Diet   No. Medications  No. Follow up visit  No.  Do you have questions or concerns about your Care? No.  Actions: * If pain score is 4 or above: No action needed, pain <4.  Have you developed a fever since your procedure? no  2.   Have you had an respiratory symptoms (SOB or cough) since your procedure? no  3.   Have you tested positive for COVID 19 since your procedure no  4.   Have you had any family members/close contacts diagnosed with the COVID 19 since your procedure?  no   If yes to any of these questions please route to Laverna Peace, RN and Karlton Lemon, RN

## 2021-03-11 NOTE — Progress Notes (Signed)
Kristina Hines,  The biopsies taken from your stomach were notable for mild reactive gastropathy which is a common finding and often related to use of certain medications (usually NSAIDs), but there was no evidence of Helicobacter pylori infection. This common finding is not felt to necessarily be a cause of any particular symptom and there is no specific treatment or further evaluation is recommended.  Please follow up with me as needed in the clinic to further discuss your symptoms.

## 2022-04-15 IMAGING — CT CT HEAD W/O CM
3 of 4 series · 14 of 47 positions shown, 16 images · non-contrast
Comparison: None.

CLINICAL DATA: Panic attack.  Mental status change.

EXAM:
CT HEAD WITHOUT CONTRAST
TECHNIQUE: Contiguous axial images were obtained from the base of the skull
through the vertex without intravenous contrast.

[Series 5: coronal soft tissue · coronal · 0.33mm/px · 3 of 72 slices shown]
[im 24/72  brain]
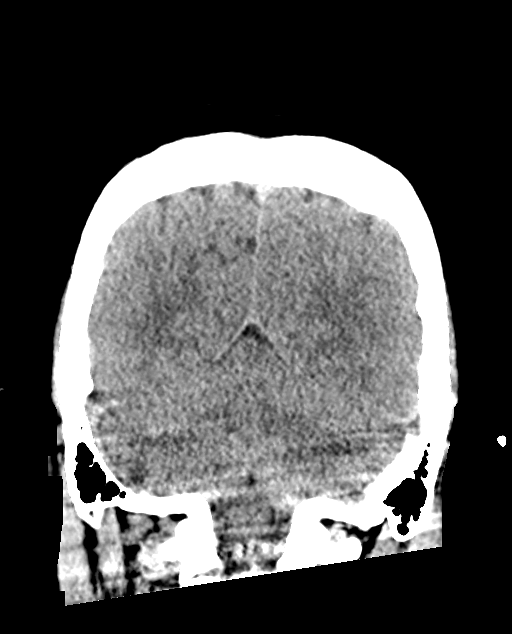
[im 32/72  brain]
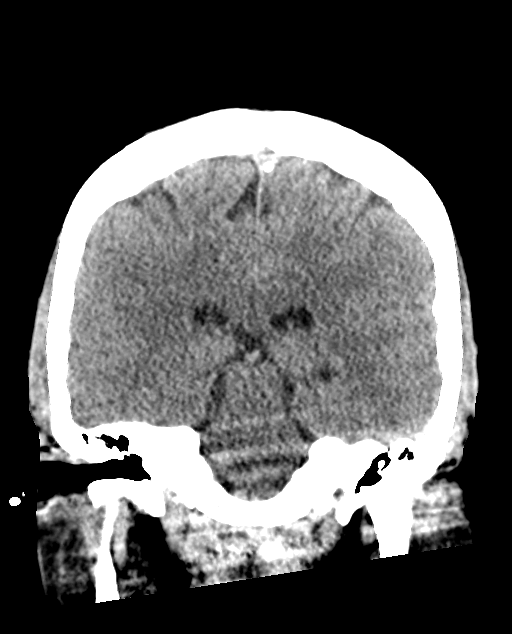
[im 40/72  brain]
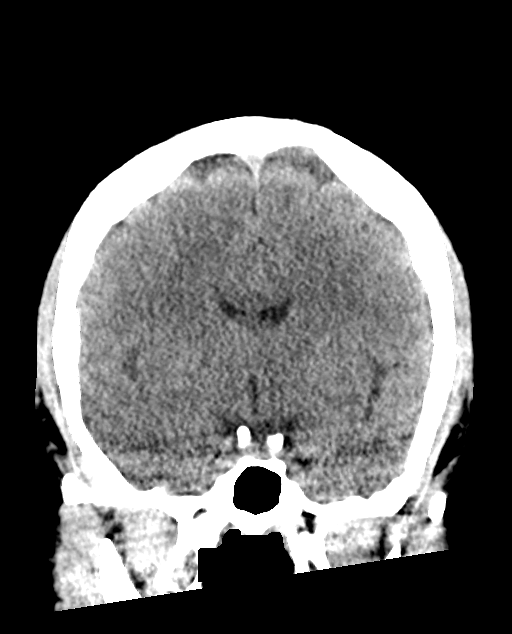

[Series 6: sagittal soft tissue · sagittal · 0.35mm/px · 3 of 57 slices shown]
[im 22/57  brain]
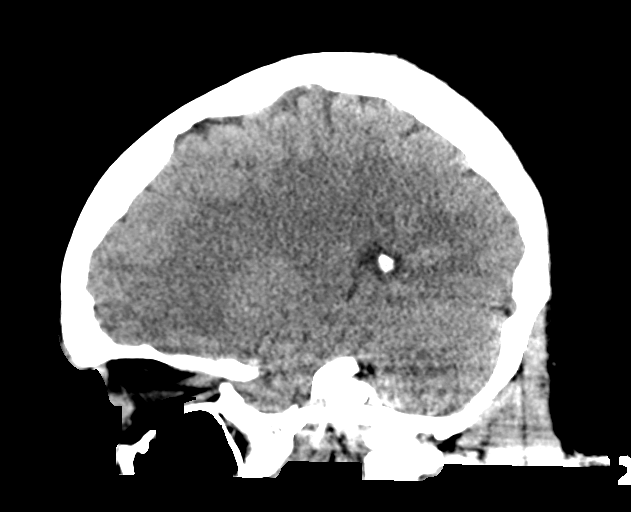
[im 29/57  brain]
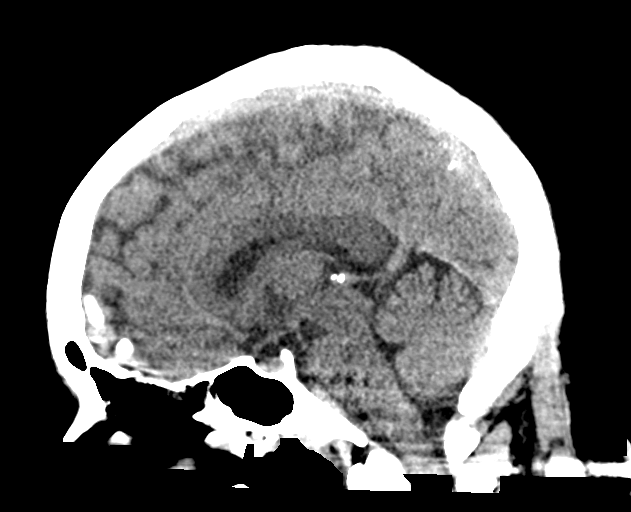
[im 35/57  brain]
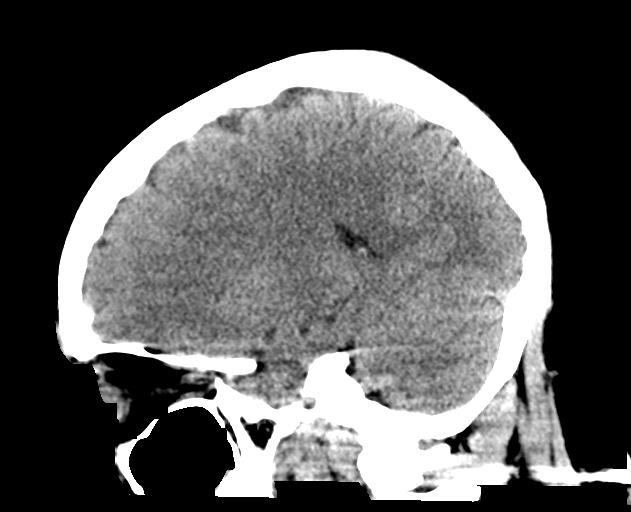

[Series 7: true axial · axial · 0.33mm/px · z∈[-142,-7]mm · 8 of 60 slices shown, 10 images]
[im 7/60  brain]
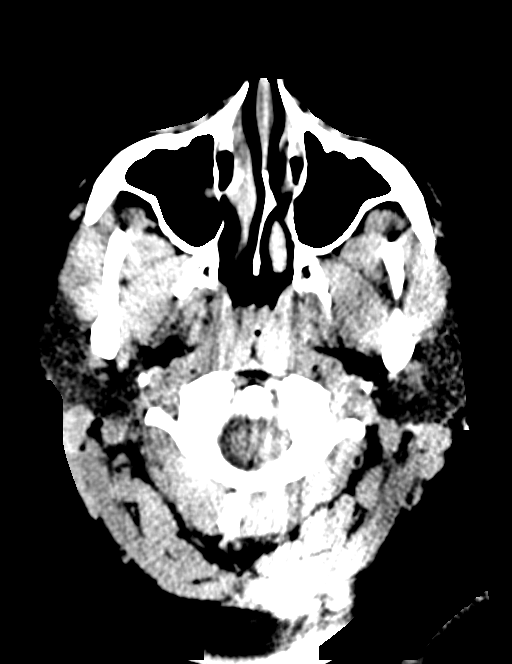
[im 7/60  bone]
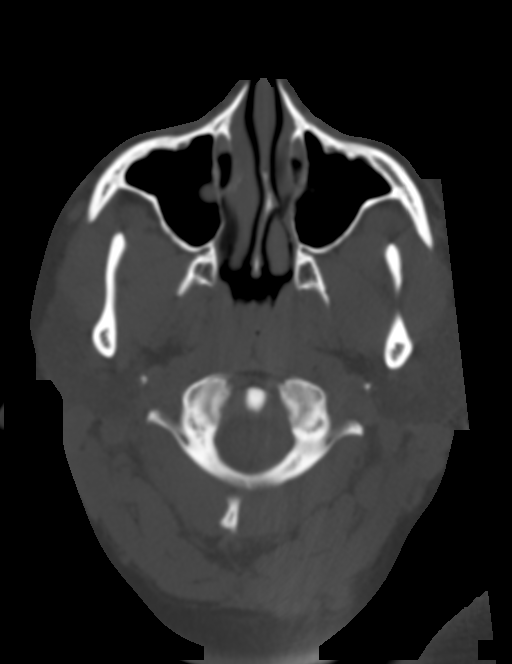
[im 14/60  brain]
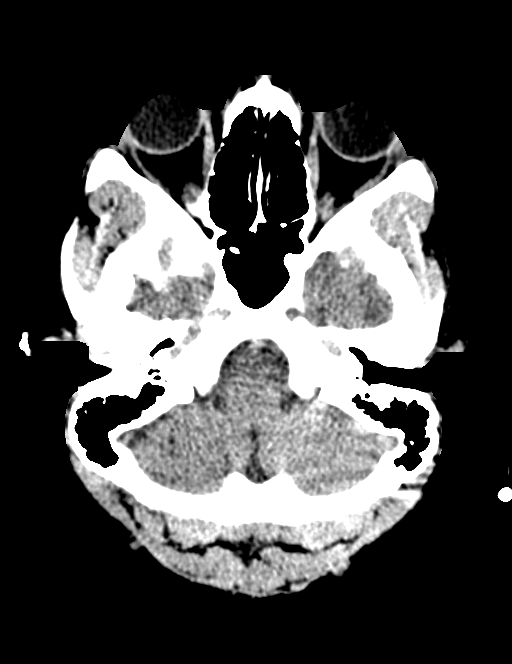
[im 20/60  brain]
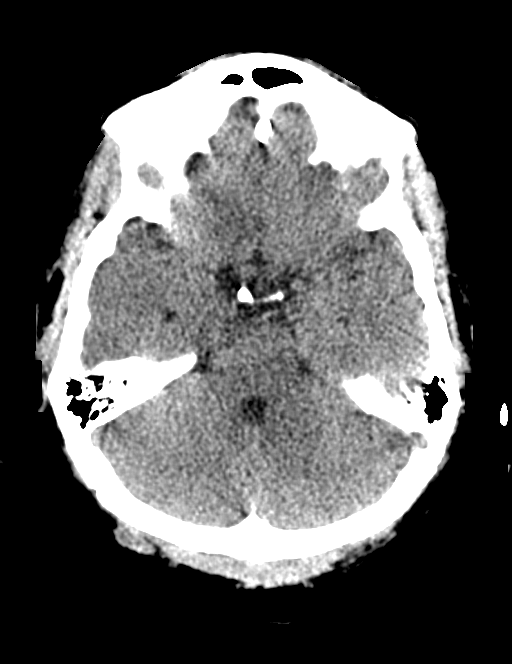
[im 27/60  brain]
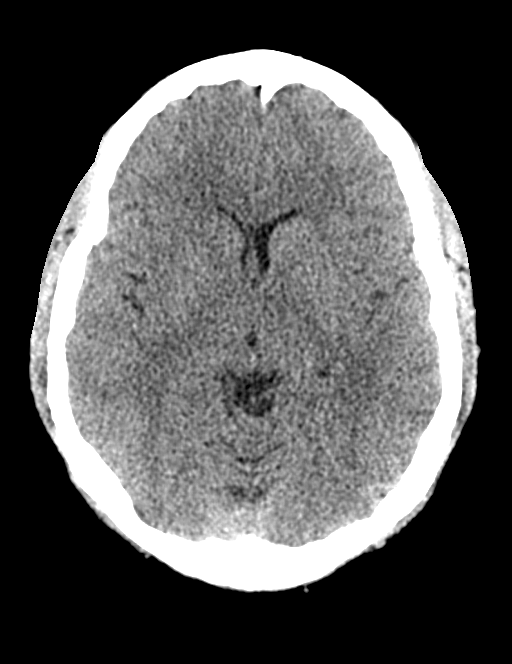
[im 33/60  brain]
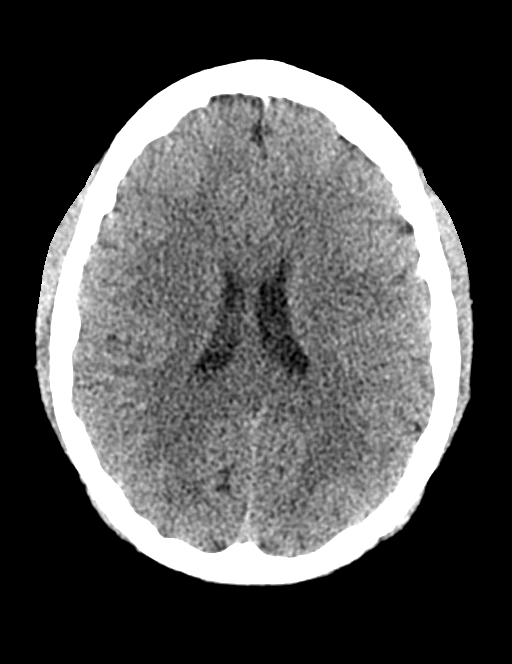
[im 33/60  bone]
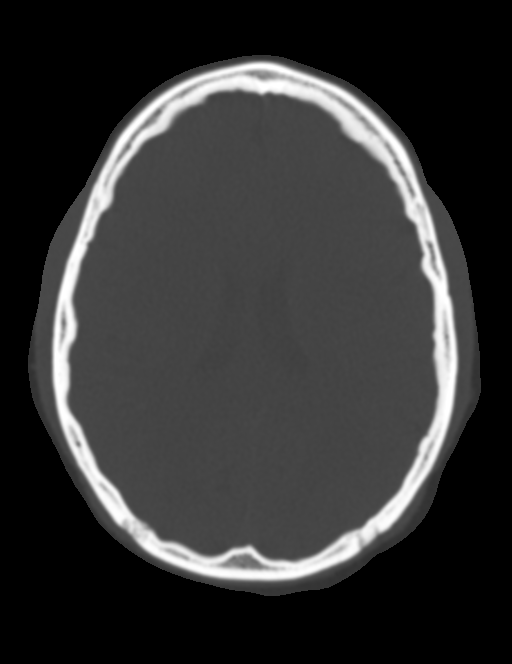
[im 40/60  brain]
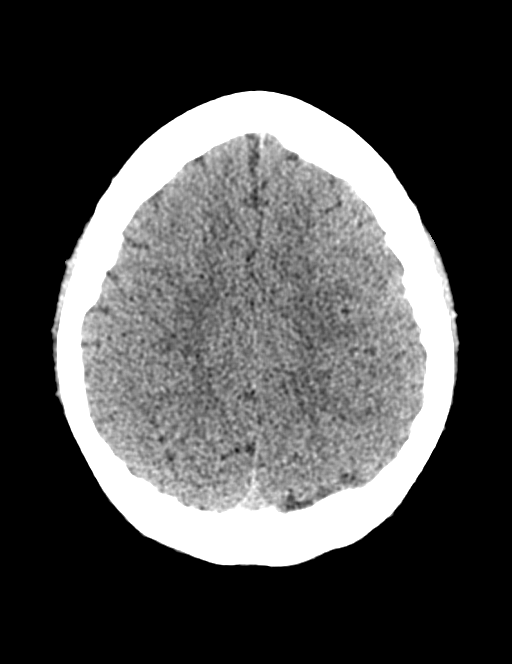
[im 46/60  brain]
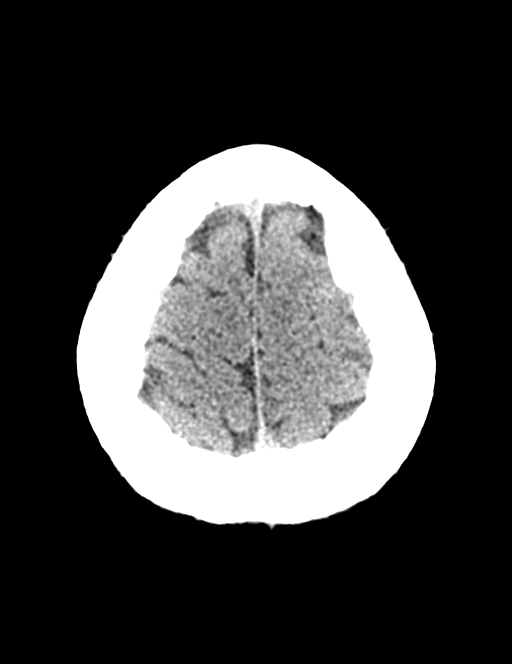
[im 53/60  brain]
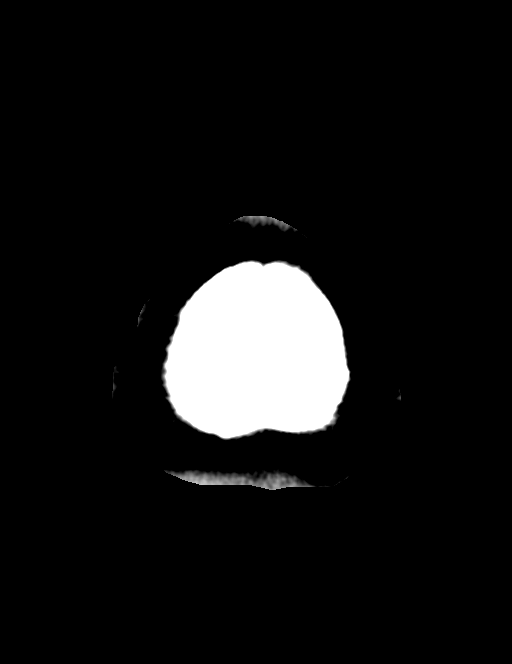

[14 of 47 positions shown; findings below may reference images not displayed]

FINDINGS: Brain: No evidence of acute infarction, hemorrhage, hydrocephalus,
extra-axial collection or mass lesion/mass effect.

Vascular: No hyperdense vessel or unexpected calcification.

Skull: Normal. Negative for fracture or focal lesion.

Sinuses/Orbits: No acute finding.
IMPRESSION: Negative head CT.

## 2022-08-18 IMAGING — DX DG CHEST 1V
1 series · 1 of 1 positions shown · non-contrast
Comparison: None.

CLINICAL DATA: Abdominal pain and fever x2 days.

EXAM:
CHEST  1 VIEW

[chest pa]
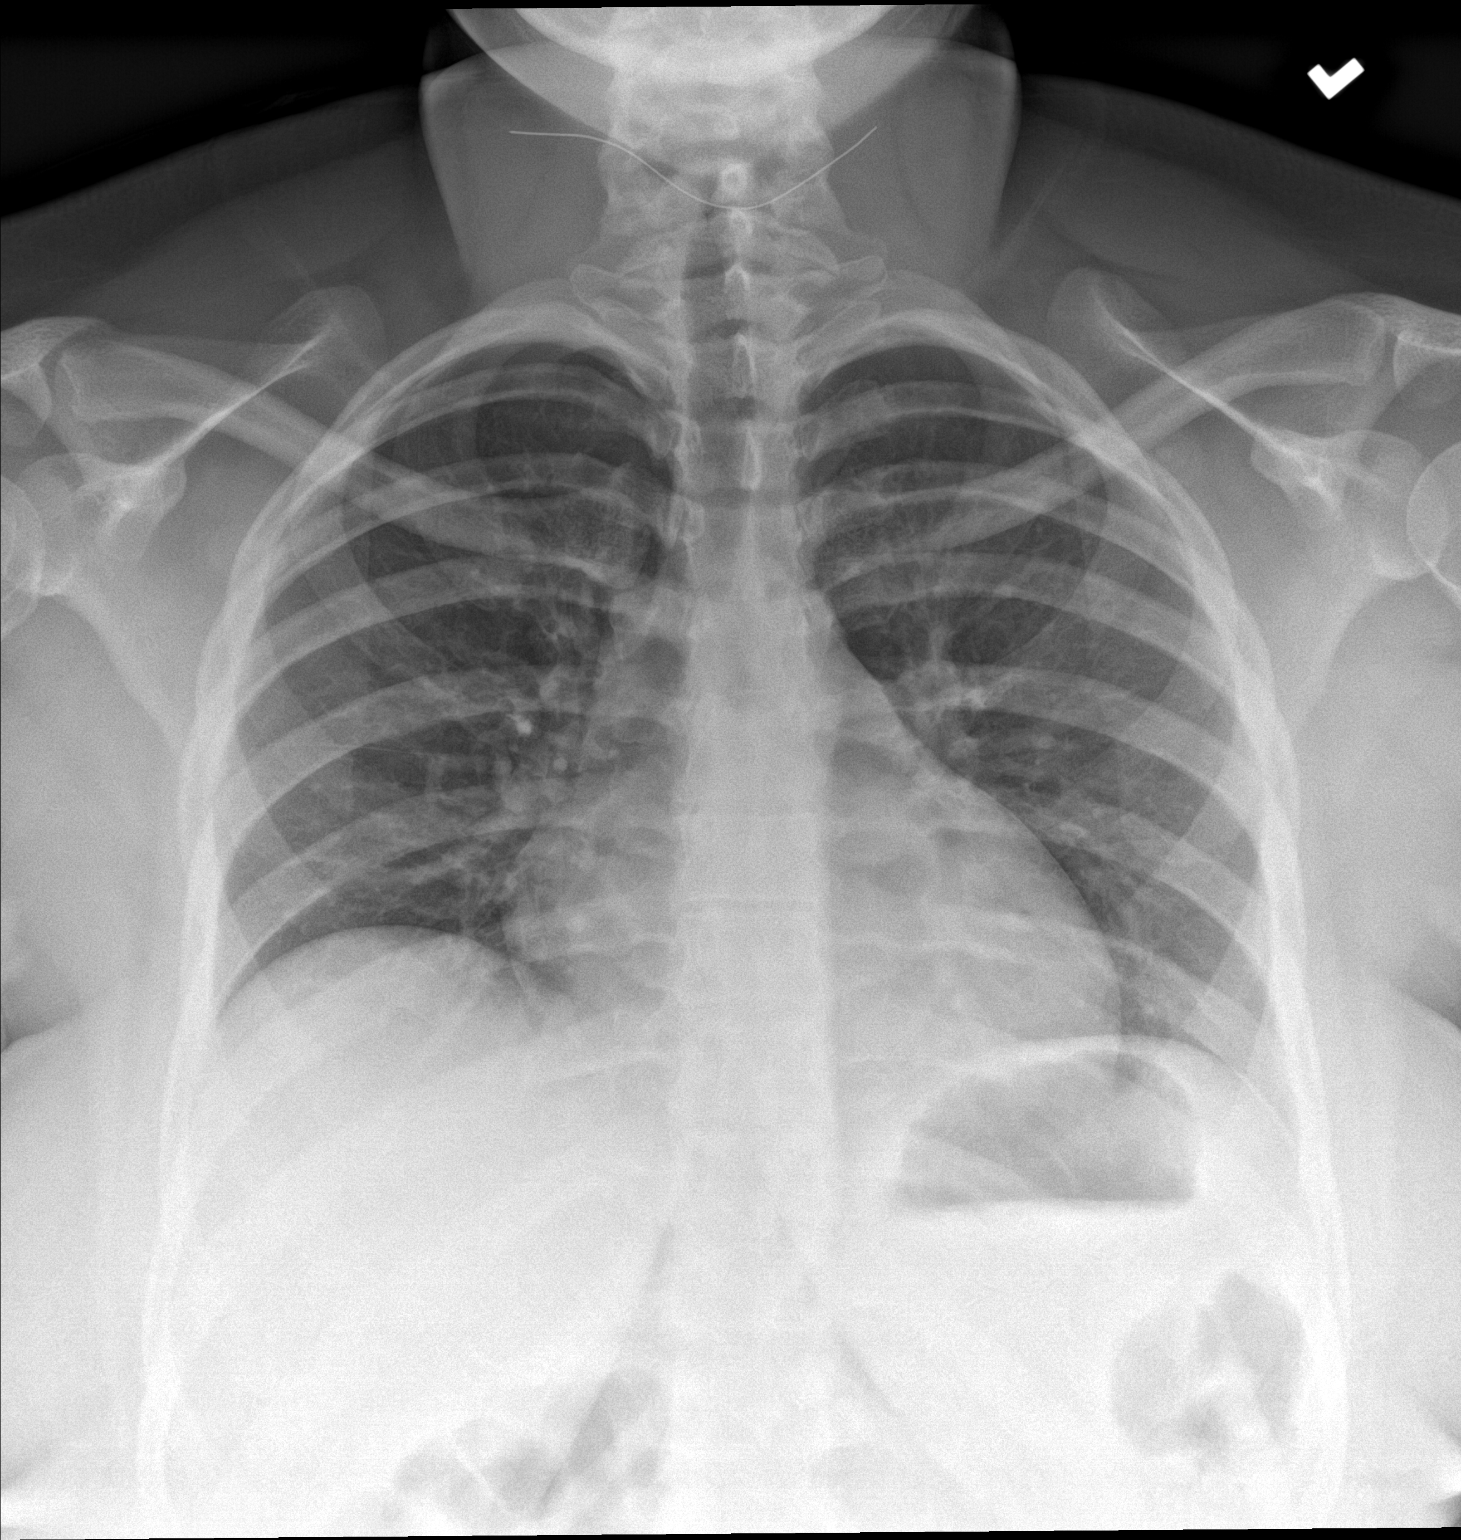

[1 of 1 positions shown; findings below may reference images not displayed]

FINDINGS: Decreased lung volumes are seen which is likely secondary to the
degree of patient inspiration. There is no evidence of an acute
infiltrate, pleural effusion or pneumothorax. The heart size and
mediastinal contours are within normal limits. The visualized
skeletal structures are unremarkable.
IMPRESSION: No active cardiopulmonary disease.
